# Patient Record
Sex: Female | Born: 1960 | Race: Black or African American | Hispanic: No | Marital: Married | Smoking: Never smoker
Health system: Southern US, Community
[De-identification: ages and names within clinical notes are randomized; demographics above are authoritative.]

## PROBLEM LIST (undated history)

## (undated) DIAGNOSIS — I10 Essential (primary) hypertension: Secondary | ICD-10-CM

## (undated) DIAGNOSIS — F32A Depression, unspecified: Secondary | ICD-10-CM

## (undated) DIAGNOSIS — Z8614 Personal history of Methicillin resistant Staphylococcus aureus infection: Secondary | ICD-10-CM

## (undated) DIAGNOSIS — F419 Anxiety disorder, unspecified: Secondary | ICD-10-CM

## (undated) DIAGNOSIS — L0292 Furuncle, unspecified: Secondary | ICD-10-CM

## (undated) HISTORY — DX: Depression, unspecified: F32.A

## (undated) HISTORY — PX: COLONOSCOPY: SHX174

## (undated) HISTORY — DX: Anxiety disorder, unspecified: F41.9

## (undated) HISTORY — DX: Furuncle, unspecified: L02.92

---

## 2010-04-18 ENCOUNTER — Emergency Department: Payer: Self-pay | Admitting: Emergency Medicine

## 2010-04-20 ENCOUNTER — Emergency Department: Payer: Self-pay | Admitting: Emergency Medicine

## 2010-08-17 HISTORY — PX: PARTIAL HYSTERECTOMY: SHX80

## 2011-08-23 ENCOUNTER — Emergency Department: Payer: Self-pay | Admitting: Emergency Medicine

## 2011-11-06 ENCOUNTER — Emergency Department: Payer: Self-pay | Admitting: Emergency Medicine

## 2014-05-06 ENCOUNTER — Emergency Department: Payer: Self-pay | Admitting: Emergency Medicine

## 2014-05-23 DIAGNOSIS — R030 Elevated blood-pressure reading, without diagnosis of hypertension: Secondary | ICD-10-CM | POA: Insufficient documentation

## 2014-06-26 ENCOUNTER — Ambulatory Visit: Payer: Self-pay | Admitting: Physician Assistant

## 2014-11-16 ENCOUNTER — Ambulatory Visit
Admit: 2014-11-16 | Disposition: A | Discharge status: Home / Self Care | Payer: Self-pay | Attending: Gastroenterology | Admitting: Gastroenterology

## 2014-12-10 LAB — SURGICAL PATHOLOGY

## 2014-12-14 DIAGNOSIS — I10 Essential (primary) hypertension: Secondary | ICD-10-CM | POA: Insufficient documentation

## 2015-09-12 ENCOUNTER — Other Ambulatory Visit: Payer: Self-pay | Admitting: Physician Assistant

## 2015-09-12 DIAGNOSIS — Z1231 Encounter for screening mammogram for malignant neoplasm of breast: Secondary | ICD-10-CM

## 2015-09-30 ENCOUNTER — Ambulatory Visit: Payer: Self-pay

## 2015-10-16 ENCOUNTER — Ambulatory Visit
Admission: RE | Admit: 2015-10-16 | Discharge: 2015-10-16 | Disposition: A | Discharge status: Home / Self Care | Payer: BLUE CROSS/BLUE SHIELD | Source: Ambulatory Visit | Attending: Physician Assistant | Admitting: Physician Assistant

## 2015-10-16 DIAGNOSIS — Z1231 Encounter for screening mammogram for malignant neoplasm of breast: Secondary | ICD-10-CM | POA: Diagnosis not present

## 2016-12-10 ENCOUNTER — Other Ambulatory Visit: Payer: Self-pay | Admitting: Physician Assistant

## 2016-12-10 DIAGNOSIS — E669 Obesity, unspecified: Secondary | ICD-10-CM | POA: Insufficient documentation

## 2016-12-10 DIAGNOSIS — Z1231 Encounter for screening mammogram for malignant neoplasm of breast: Secondary | ICD-10-CM

## 2017-01-07 ENCOUNTER — Ambulatory Visit
Admission: RE | Admit: 2017-01-07 | Discharge: 2017-01-07 | Disposition: A | Discharge status: Home / Self Care | Payer: BLUE CROSS/BLUE SHIELD | Source: Ambulatory Visit | Attending: Physician Assistant | Admitting: Physician Assistant

## 2017-01-07 DIAGNOSIS — Z1231 Encounter for screening mammogram for malignant neoplasm of breast: Secondary | ICD-10-CM | POA: Diagnosis not present

## 2018-01-05 ENCOUNTER — Other Ambulatory Visit: Payer: Self-pay | Admitting: Physician Assistant

## 2018-01-05 DIAGNOSIS — Z1231 Encounter for screening mammogram for malignant neoplasm of breast: Secondary | ICD-10-CM

## 2018-01-26 ENCOUNTER — Ambulatory Visit
Admission: RE | Admit: 2018-01-26 | Discharge: 2018-01-26 | Disposition: A | Discharge status: Home / Self Care | Payer: BLUE CROSS/BLUE SHIELD | Source: Ambulatory Visit | Attending: Physician Assistant | Admitting: Physician Assistant

## 2018-01-26 DIAGNOSIS — Z1231 Encounter for screening mammogram for malignant neoplasm of breast: Secondary | ICD-10-CM | POA: Insufficient documentation

## 2019-03-30 ENCOUNTER — Other Ambulatory Visit: Payer: Self-pay | Admitting: Physician Assistant

## 2019-03-30 DIAGNOSIS — Z1231 Encounter for screening mammogram for malignant neoplasm of breast: Secondary | ICD-10-CM

## 2020-04-26 ENCOUNTER — Ambulatory Visit
Admission: RE | Admit: 2020-04-26 | Discharge: 2020-04-26 | Disposition: A | Discharge status: Home / Self Care | Payer: 59 | Source: Ambulatory Visit | Attending: Physician Assistant | Admitting: Physician Assistant

## 2020-04-26 DIAGNOSIS — Z1231 Encounter for screening mammogram for malignant neoplasm of breast: Secondary | ICD-10-CM | POA: Insufficient documentation

## 2021-04-08 ENCOUNTER — Other Ambulatory Visit: Payer: Self-pay | Admitting: Physician Assistant

## 2021-04-08 DIAGNOSIS — Z1231 Encounter for screening mammogram for malignant neoplasm of breast: Secondary | ICD-10-CM

## 2021-04-23 ENCOUNTER — Encounter (INDEPENDENT_AMBULATORY_CARE_PROVIDER_SITE_OTHER): Payer: Self-pay

## 2021-04-23 ENCOUNTER — Encounter: Payer: Self-pay | Admitting: Oncology

## 2021-04-23 ENCOUNTER — Inpatient Hospital Stay: Payer: 59 | Attending: Oncology | Admitting: Oncology

## 2021-04-23 ENCOUNTER — Inpatient Hospital Stay: Payer: 59

## 2021-04-23 ENCOUNTER — Other Ambulatory Visit: Payer: Self-pay

## 2021-04-23 VITALS — BP 148/76 | HR 57 | Temp 97.1°F | Resp 17 | Wt 270.0 lb

## 2021-04-23 DIAGNOSIS — D72829 Elevated white blood cell count, unspecified: Secondary | ICD-10-CM

## 2021-04-23 DIAGNOSIS — Z808 Family history of malignant neoplasm of other organs or systems: Secondary | ICD-10-CM | POA: Diagnosis not present

## 2021-04-23 DIAGNOSIS — A4902 Methicillin resistant Staphylococcus aureus infection, unspecified site: Secondary | ICD-10-CM | POA: Insufficient documentation

## 2021-04-23 DIAGNOSIS — F419 Anxiety disorder, unspecified: Secondary | ICD-10-CM | POA: Insufficient documentation

## 2021-04-23 LAB — CBC WITH DIFFERENTIAL/PLATELET
Abs Immature Granulocytes: 0.07 10*3/uL (ref 0.00–0.07)
Basophils Absolute: 0.1 10*3/uL (ref 0.0–0.1)
Basophils Relative: 1 %
Eosinophils Absolute: 0.3 10*3/uL (ref 0.0–0.5)
Eosinophils Relative: 2 %
HCT: 41.2 % (ref 36.0–46.0)
Hemoglobin: 13.6 g/dL (ref 12.0–15.0)
Immature Granulocytes: 1 %
Lymphocytes Relative: 26 %
Lymphs Abs: 2.9 10*3/uL (ref 0.7–4.0)
MCH: 29.4 pg (ref 26.0–34.0)
MCHC: 33 g/dL (ref 30.0–36.0)
MCV: 89 fL (ref 80.0–100.0)
Monocytes Absolute: 0.8 10*3/uL (ref 0.1–1.0)
Monocytes Relative: 7 %
Neutro Abs: 7.3 10*3/uL (ref 1.7–7.7)
Neutrophils Relative %: 63 %
Platelets: 236 10*3/uL (ref 150–400)
RBC: 4.63 MIL/uL (ref 3.87–5.11)
RDW: 14.4 % (ref 11.5–15.5)
WBC: 11.3 10*3/uL — ABNORMAL HIGH (ref 4.0–10.5)
nRBC: 0 % (ref 0.0–0.2)

## 2021-04-23 LAB — TECHNOLOGIST SMEAR REVIEW: Plt Morphology: NORMAL

## 2021-04-23 LAB — TSH: TSH: 3.28 u[IU]/mL (ref 0.350–4.500)

## 2021-04-23 LAB — LACTATE DEHYDROGENASE: LDH: 155 U/L (ref 98–192)

## 2021-04-23 NOTE — Progress Notes (Signed)
Hematology/Oncology Consult note [facility] Telephone:([phone] Fax:([phone]) [phone]   Patient Care Team: Patrice Paradise, MD as PCP - General (Physician Assistant)  REFERRING PROVIDER: Patrice Paradise, MD  CHIEF COMPLAINTS/REASON FOR VISIT:  Evaluation of leukocytosis  HISTORY OF PRESENTING ILLNESS:  Laura Shaw is a  60 y.o.  female with PMH listed below who was referred to me for evaluation of leukocytosis Reviewed patient' recent labs obtained by PCP.   04/08/2021 CBC showed elevated white count of 13.6  Previous lab records reviewed. Leukocytosis onset of chronic, duration is since 2017  No aggravating or elevated factors. Associated symptoms or signs:  Denies weight loss, fever, chills,night sweats.  She does not smoke. Denies recent oral steroid use or steroid injection:  Denies any joint pain or stiffness.    Review of Systems  Constitutional:  Negative for appetite change, chills, fatigue and fever.  HENT:   Negative for hearing loss and voice change.   Eyes:  Negative for eye problems.  Respiratory:  Negative for chest tightness and cough.   Cardiovascular:  Negative for chest pain.  Gastrointestinal:  Negative for abdominal distention, abdominal pain and blood in stool.  Endocrine: Negative for hot flashes.  Genitourinary:  Negative for difficulty urinating and frequency.   Musculoskeletal:  Negative for arthralgias.  Skin:  Negative for itching and rash.  Neurological:  Negative for extremity weakness.  Hematological:  Negative for adenopathy.  Psychiatric/Behavioral:  Negative for confusion.     MEDICAL HISTORY:  Past Medical History:  Diagnosis Date   Anxiety    Boils of multiple sites    Depression     SURGICAL HISTORY: Past Surgical History:  Procedure Laterality Date   PARTIAL HYSTERECTOMY  2012    SOCIAL HISTORY: Social History   Socioeconomic History   Marital status: Single    Spouse name: Not on  file   Number of children: Not on file   Years of education: Not on file   Highest education level: Not on file  Occupational History   Not on file  Tobacco Use   Smoking status: Never   Smokeless tobacco: Never  Substance and Sexual Activity   Alcohol use: Not Currently   Drug use: Not Currently   Sexual activity: Not on file  Other Topics Concern   Not on file  Social History Narrative   Not on file   Social Determinants of Health   Financial Resource Strain: Not on file  Food Insecurity: Not on file  Transportation Needs: Not on file  Physical Activity: Not on file  Stress: Not on file  Social Connections: Not on file  Intimate Partner Violence: Not on file    FAMILY HISTORY: Family History  Problem Relation Age of Onset   Liver cancer Mother    Diabetes Father    Breast cancer Neg Hx     ALLERGIES:  has no allergies on file.  MEDICATIONS:  Current Outpatient Medications  Medication Sig Dispense Refill   Cholecalciferol 50 MCG (2000 UT) CAPS Take by mouth.     cyanocobalamin 1000 MCG tablet Take by mouth.     escitalopram (LEXAPRO) 10 MG tablet Take 10 mg by mouth daily.     hydrochlorothiazide (HYDRODIURIL) 25 MG tablet Take 25 mg by mouth daily.     propranolol (INDERAL) 10 MG tablet Take 10 mg by mouth 2 (two) times daily.     No current facility-administered medications for this visit.     PHYSICAL EXAMINATION: ECOG  PERFORMANCE STATUS: 0 - Asymptomatic Vitals:   04/23/21 1107 04/23/21 1108  BP:  (!) 148/76  Pulse: (!) 57   Resp: 17   Temp: (!) 97.1 F (36.2 C)   SpO2: 98%    Filed Weights   04/23/21 1107  Weight: 270 lb (122.5 kg)    Physical Exam Constitutional:      General: She is not in acute distress.    Appearance: She is obese.  HENT:     Head: Normocephalic and atraumatic.  Eyes:     General: No scleral icterus. Cardiovascular:     Rate and Rhythm: Normal rate and regular rhythm.     Heart sounds: Normal heart sounds.   Pulmonary:     Effort: Pulmonary effort is normal. No respiratory distress.     Breath sounds: No wheezing.  Abdominal:     General: Bowel sounds are normal. There is no distension.     Palpations: Abdomen is soft.  Musculoskeletal:        General: No deformity. Normal range of motion.     Cervical back: Normal range of motion and neck supple.  Skin:    General: Skin is warm and dry.     Findings: No erythema or rash.  Neurological:     Mental Status: She is alert and oriented to person, place, and time. Mental status is at baseline.     Cranial Nerves: No cranial nerve deficit.     Coordination: Coordination normal.  Psychiatric:        Mood and Affect: Mood normal.    No flowsheet data found. CBC Latest Ref Rng & Units 04/23/2021  WBC 4.0 - 10.5 K/uL 11.3(H)  Hemoglobin 12.0 - 15.0 g/dL 01.7  Hematocrit 51.0 - 46.0 % 41.2  Platelets 150 - 400 K/uL 236     RADIOGRAPHIC STUDIES: I have personally reviewed the radiological images as listed and agreed with the findings in the report. No results found.  LABORATORY DATA:  I have reviewed the data as listed Lab Results  Component Value Date   WBC 11.3 (H) 04/23/2021   HGB 13.6 04/23/2021   HCT 41.2 04/23/2021   MCV 89.0 04/23/2021   PLT 236 04/23/2021   No results for input(s): NA, K, CL, CO2, GLUCOSE, BUN, CREATININE, CALCIUM, GFRNONAA, GFRAA, PROT, ALBUMIN, AST, ALT, ALKPHOS, BILITOT, BILIDIR, IBILI in the last 8760 hours. Iron/TIBC/Ferritin/ %Sat No results found for: IRON, TIBC, FERRITIN, IRONPCTSAT      ASSESSMENT & PLAN:  1. Leukocytosis, unspecified type    Labs reviewed and discussed with patient that Leukocytosis, predominantly neutrophilia, can be secondary to infection, chronic inflammation, smoking, autoimmune disease, or underlying bone marrow disorders.  Obesity may contribute to leukocytosis.  For the work up of patient's leukocytosis, I recommend checking CBC;CMP, LDH,  SPEP,smear review,  flowcytometry, ANA etc.    Orders Placed This Encounter  Procedures   CBC with Differential/Platelet    Standing Status:   Future    Number of Occurrences:   1    Standing Expiration Date:   04/23/2022   Technologist smear review    Standing Status:   Future    Number of Occurrences:   1    Standing Expiration Date:   04/23/2022   Lactate dehydrogenase    Standing Status:   Future    Number of Occurrences:   1    Standing Expiration Date:   04/23/2022   Flow cytometry panel-leukemia/lymphoma work-up    Standing Status:   Future  Number of Occurrences:   1    Standing Expiration Date:   04/23/2022   ANA, IFA (with reflex)    Standing Status:   Future    Number of Occurrences:   1    Standing Expiration Date:   04/23/2022   Protein electrophoresis, serum    Standing Status:   Future    Number of Occurrences:   1    Standing Expiration Date:   04/23/2022   TSH    Standing Status:   Future    Number of Occurrences:   1    Standing Expiration Date:   04/23/2022    All questions were answered. The patient knows to call the clinic with any problems questions or concerns.  Return of visit: 2 weeks to discuss labs. Thank you for this kind referral and the opportunity to participate in the care of this patient. A copy of today's note is routed to referring provider   Rickard Patience, MD, PhD Hematology Oncology [facility]  04/23/2021

## 2021-04-25 LAB — COMP PANEL: LEUKEMIA/LYMPHOMA

## 2021-04-28 LAB — PROTEIN ELECTROPHORESIS, SERUM
A/G Ratio: 0.9 (ref 0.7–1.7)
Albumin ELP: 3.3 g/dL (ref 2.9–4.4)
Alpha-1-Globulin: 0.2 g/dL (ref 0.0–0.4)
Alpha-2-Globulin: 0.8 g/dL (ref 0.4–1.0)
Beta Globulin: 1.1 g/dL (ref 0.7–1.3)
Gamma Globulin: 1.6 g/dL (ref 0.4–1.8)
Globulin, Total: 3.8 g/dL (ref 2.2–3.9)
Total Protein ELP: 7.1 g/dL (ref 6.0–8.5)

## 2021-04-29 ENCOUNTER — Other Ambulatory Visit: Payer: Self-pay

## 2021-04-29 ENCOUNTER — Ambulatory Visit
Admission: RE | Admit: 2021-04-29 | Discharge: 2021-04-29 | Disposition: A | Discharge status: Home / Self Care | Payer: 59 | Source: Ambulatory Visit | Attending: Physician Assistant | Admitting: Physician Assistant

## 2021-04-29 DIAGNOSIS — Z1231 Encounter for screening mammogram for malignant neoplasm of breast: Secondary | ICD-10-CM | POA: Diagnosis present

## 2021-04-30 LAB — ANTINUCLEAR ANTIBODIES, IFA: ANA Ab, IFA: NEGATIVE

## 2021-05-08 ENCOUNTER — Other Ambulatory Visit: Payer: Self-pay

## 2021-05-08 ENCOUNTER — Encounter: Payer: Self-pay | Admitting: Oncology

## 2021-05-08 ENCOUNTER — Inpatient Hospital Stay (HOSPITAL_BASED_OUTPATIENT_CLINIC_OR_DEPARTMENT_OTHER): Payer: 59 | Admitting: Oncology

## 2021-05-08 DIAGNOSIS — D72829 Elevated white blood cell count, unspecified: Secondary | ICD-10-CM

## 2021-05-08 NOTE — Progress Notes (Signed)
HEMATOLOGY-ONCOLOGY TeleHEALTH VISIT PROGRESS NOTE  I connected with Laura Shaw on 05/08/21  at  2:15 PM EDT by video enabled telemedicine visit and verified that I am speaking with the correct person using two identifiers. I discussed the limitations, risks, security and privacy concerns of performing an evaluation and management service by telemedicine and the availability of in-person appointments. The patient expressed understanding and agreed to proceed.   Other persons participating in the visit and their role in the encounter:  None  Patient's location: Home  Provider's location: office Chief Complaint: Follow-up for leukocytosis   INTERVAL HISTORY Laura Shaw is a 60 y.o. female who has above history reviewed by me today presents for follow up visit for leukocytosis  Patient has had a blood work done 2 weeks ago and present to discuss results.   Review of Systems  Constitutional:  Negative for appetite change, chills, fatigue and fever.  HENT:   Negative for hearing loss and voice change.   Eyes:  Negative for eye problems.  Respiratory:  Negative for chest tightness and cough.   Cardiovascular:  Negative for chest pain.  Gastrointestinal:  Negative for abdominal distention, abdominal pain and blood in stool.  Endocrine: Negative for hot flashes.  Genitourinary:  Negative for difficulty urinating and frequency.   Musculoskeletal:  Negative for arthralgias.  Skin:  Negative for itching and rash.  Neurological:  Negative for extremity weakness.  Hematological:  Negative for adenopathy.  Psychiatric/Behavioral:  Negative for confusion.    Past Medical History:  Diagnosis Date   Anxiety    Boils of multiple sites    Depression    Past Surgical History:  Procedure Laterality Date   PARTIAL HYSTERECTOMY  2012    Family History  Problem Relation Age of Onset   Liver cancer Mother    Diabetes Father    Breast cancer Neg Hx     Social History   Socioeconomic  History   Marital status: Single    Spouse name: Not on file   Number of children: Not on file   Years of education: Not on file   Highest education level: Not on file  Occupational History   Not on file  Tobacco Use   Smoking status: Never   Smokeless tobacco: Never  Substance and Sexual Activity   Alcohol use: Not Currently   Drug use: Not Currently   Sexual activity: Not on file  Other Topics Concern   Not on file  Social History Narrative   Not on file   Social Determinants of Health   Financial Resource Strain: Not on file  Food Insecurity: Not on file  Transportation Needs: Not on file  Physical Activity: Not on file  Stress: Not on file  Social Connections: Not on file  Intimate Partner Violence: Not on file    Current Outpatient Medications on File Prior to Visit  Medication Sig Dispense Refill   Cholecalciferol 50 MCG (2000 UT) CAPS Take by mouth.     cyanocobalamin 1000 MCG tablet Take by mouth.     escitalopram (LEXAPRO) 10 MG tablet Take 10 mg by mouth daily.     hydrochlorothiazide (HYDRODIURIL) 25 MG tablet Take 25 mg by mouth daily.     propranolol (INDERAL) 10 MG tablet Take 10 mg by mouth 2 (two) times daily.     No current facility-administered medications on file prior to visit.    Not on File     Observations/Objective: Today's Vitals   05/08/21 1356  PainSc:  0-No pain   There is no height or weight on file to calculate BMI.  Physical Exam  CBC    Component Value Date/Time   WBC 11.3 (H) 04/23/2021 1134   RBC 4.63 04/23/2021 1134   HGB 13.6 04/23/2021 1134   HCT 41.2 04/23/2021 1134   PLT 236 04/23/2021 1134   MCV 89.0 04/23/2021 1134   MCH 29.4 04/23/2021 1134   MCHC 33.0 04/23/2021 1134   RDW 14.4 04/23/2021 1134   LYMPHSABS 2.9 04/23/2021 1134   MONOABS 0.8 04/23/2021 1134   EOSABS 0.3 04/23/2021 1134   BASOSABS 0.1 04/23/2021 1134    CMP  No results found for: NA, K, CL, CO2, GLUCOSE, BUN, CREATININE, CALCIUM, PROT, ALBUMIN,  AST, ALT, ALKPHOS, BILITOT, GFRNONAA, GFRAA   Assessment and Plan: 1. Leukocytosis, unspecified type   Labs reviewed and discussed with patient.  Patient has slightly elevated total WBC with complete normal differential. Negative protein electrophoresis, negative peripheral blood flow cytometry, negative ANA, negative LDH.  Normal TSH. Discussed with the patient that the leukocytosis most likely reactive.  Patient has no constitutional symptoms. Recommend observation.  Follow Up Instructions: Patient will be discharged to follow-up with primary care provider.  She agrees with the plan.   I discussed the assessment and treatment plan with the patient. The patient was provided an opportunity to ask questions and all were answered. The patient agreed with the plan and demonstrated an understanding of the instructions.  The patient was advised to call back or seek an in-person evaluation if the symptoms worsen or if the condition fails to improve as anticipated.  Rickard Patience, MD 05/08/2021 7:53 PM

## 2021-11-12 IMAGING — MG MM DIGITAL SCREENING BILAT W/ TOMO AND CAD
8 of 15 series · 8 of 40 positions shown · non-contrast
Comparison: Previous exam(s).

CLINICAL DATA: Screening.

EXAM:
DIGITAL SCREENING BILATERAL MAMMOGRAM WITH TOMOSYNTHESIS AND CAD
TECHNIQUE: Bilateral screening digital craniocaudal and mediolateral oblique
mammograms were obtained. Bilateral screening digital breast
tomosynthesis was performed. The images were evaluated with
computer-aided detection.

[R CC synth-2D (1 of 2)]
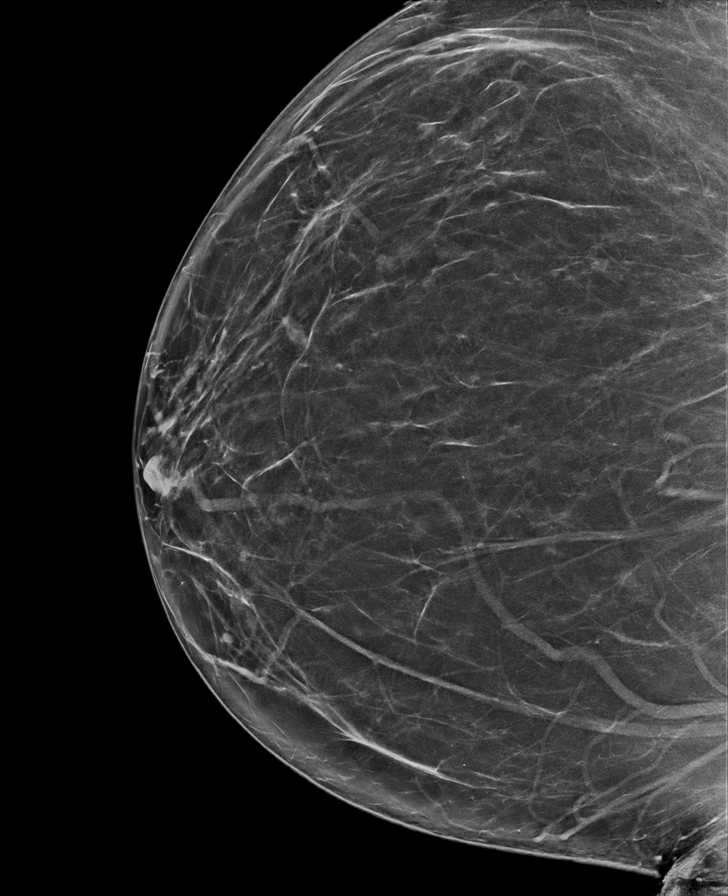

[R CC synth-2D (2 of 2)]
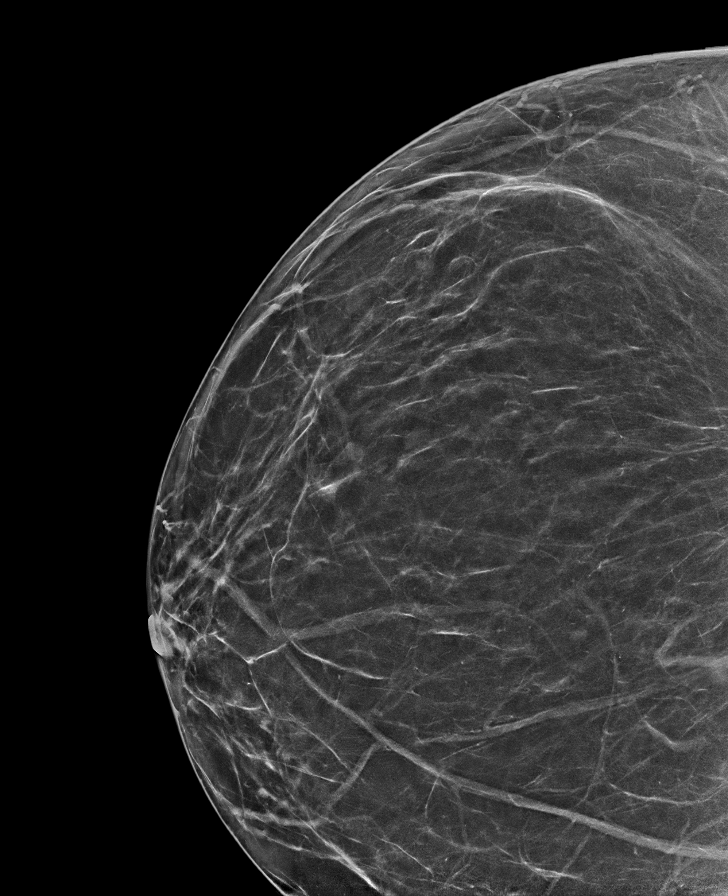

[R MLO synth-2D (1 of 2)]
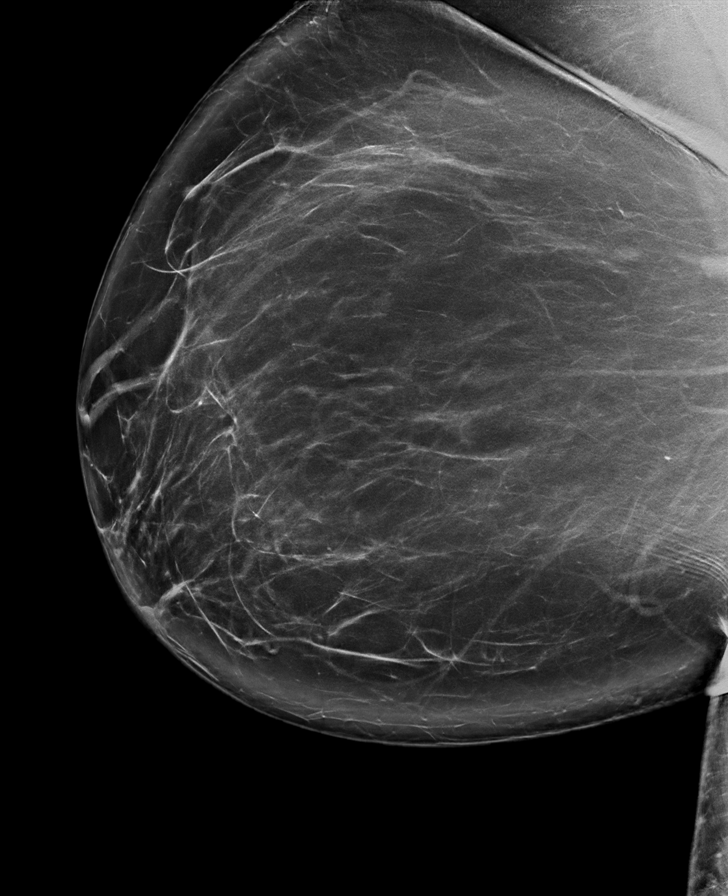

[L MLO synth-2D (1 of 2)]
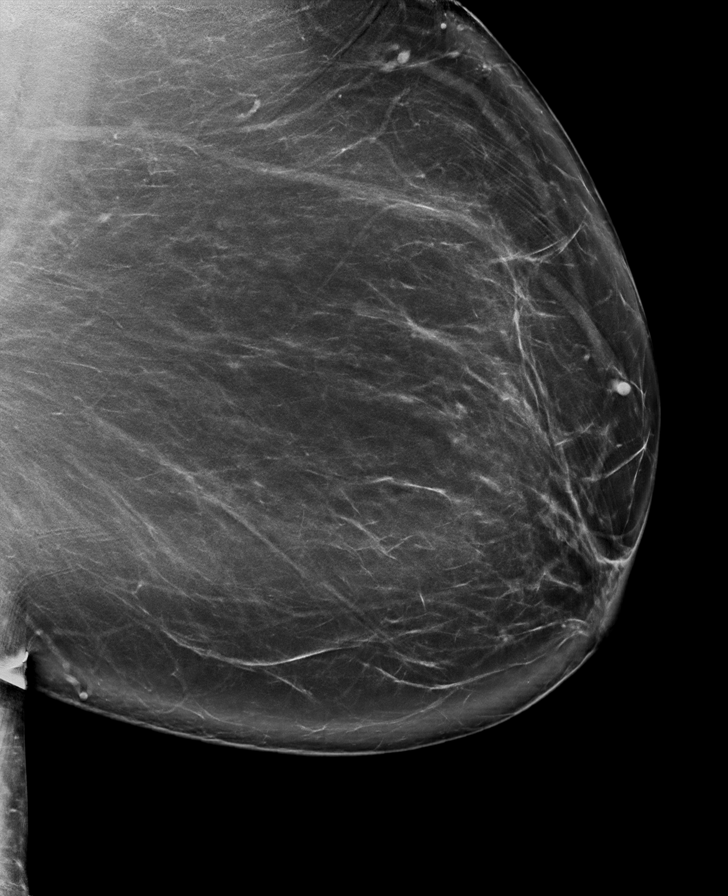

[L CC synth-2D]
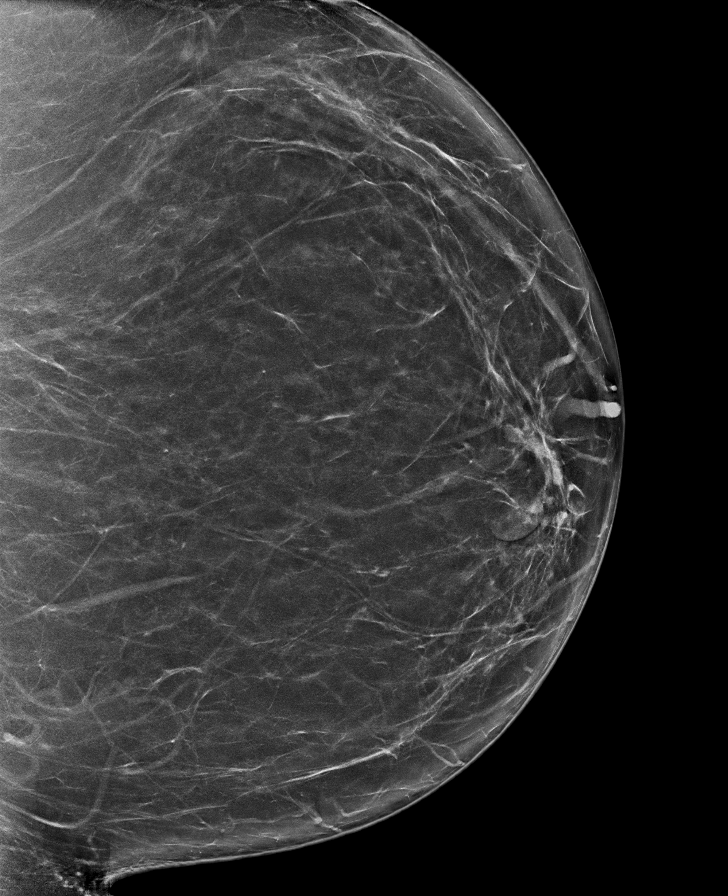

[R MLO synth-2D (2 of 2)]
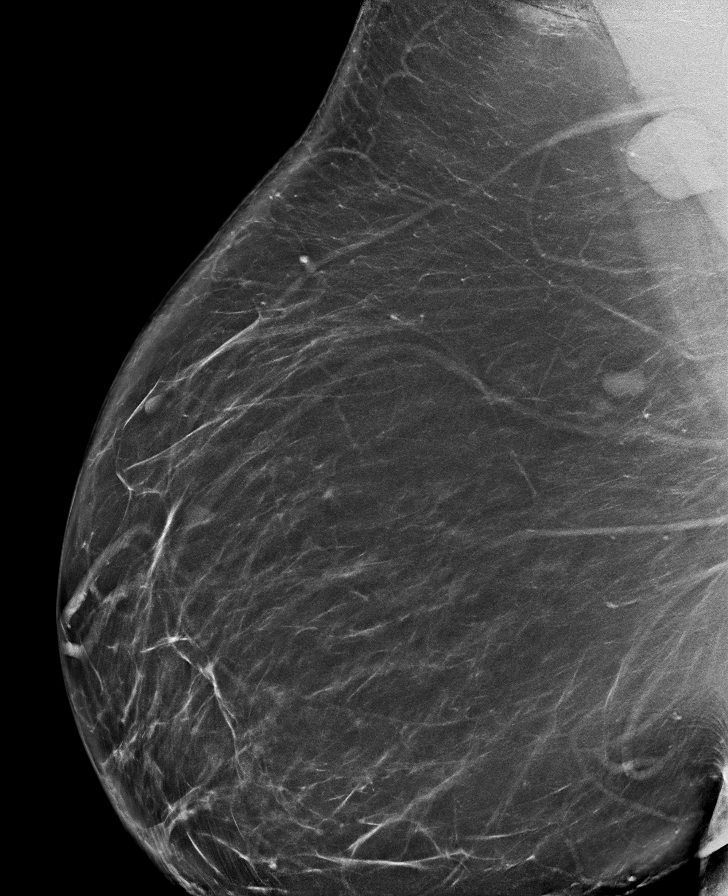

[L MLO synth-2D (2 of 2)]
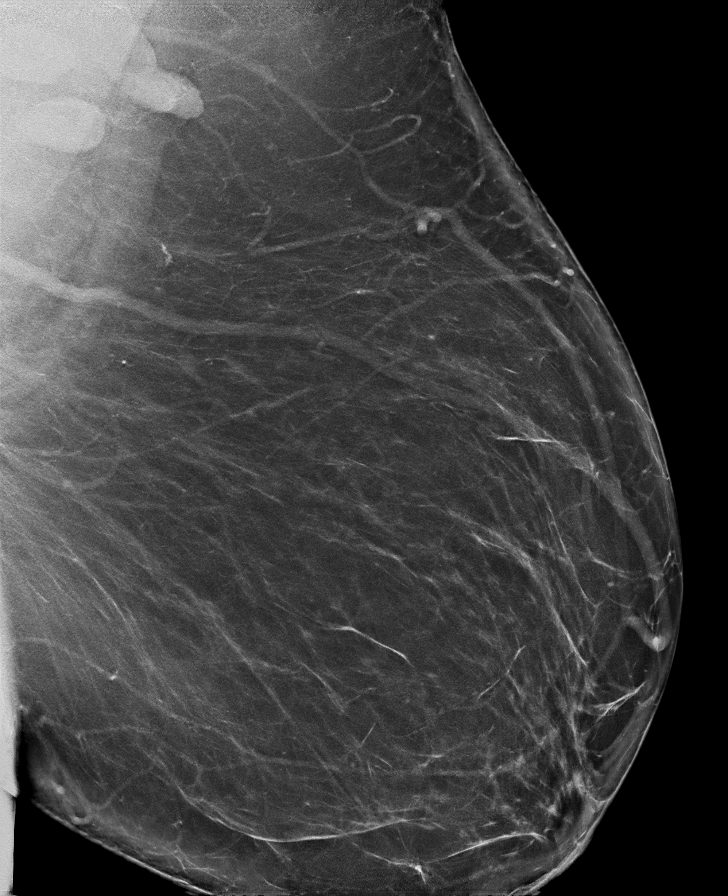

[L CC tomo · tomo slice 54/79.0]
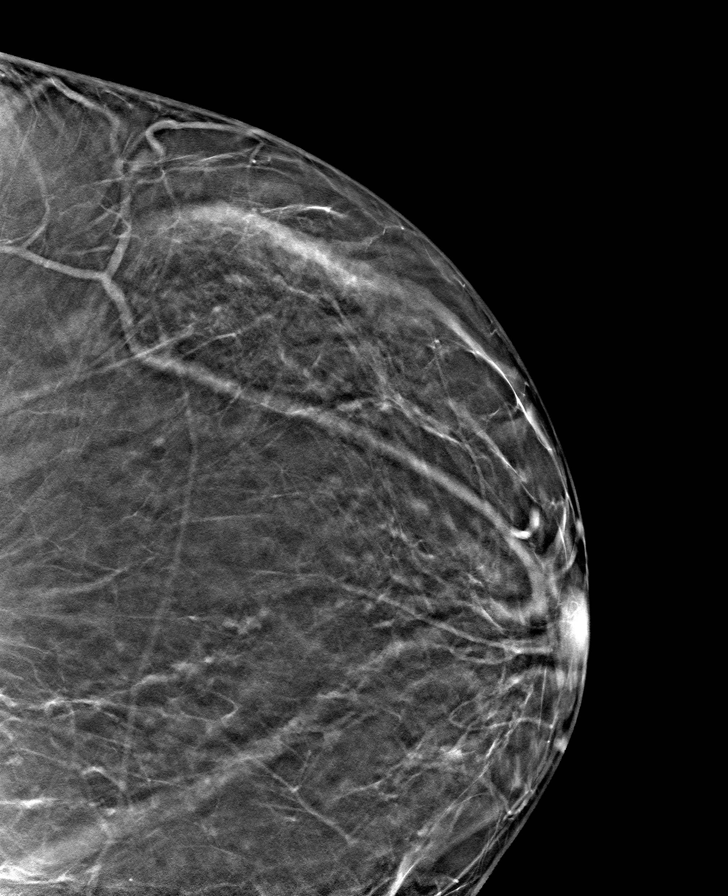

[8 of 40 positions shown; findings below may reference images not displayed]

ACR Breast Density Category b: There are scattered areas of
fibroglandular density.
FINDINGS: There are no findings suspicious for malignancy.
IMPRESSION: No mammographic evidence of malignancy. A result letter of this
screening mammogram will be mailed directly to the patient.

RECOMMENDATION:
Screening mammogram in one year. (Code:51-O-LD2)

BI-RADS CATEGORY  1: Negative.

## 2021-12-17 ENCOUNTER — Other Ambulatory Visit: Payer: Self-pay | Admitting: Physician Assistant

## 2021-12-17 DIAGNOSIS — Z1231 Encounter for screening mammogram for malignant neoplasm of breast: Secondary | ICD-10-CM

## 2021-12-19 ENCOUNTER — Encounter: Payer: Self-pay | Admitting: *Deleted

## 2021-12-19 ENCOUNTER — Ambulatory Visit: Payer: No Typology Code available for payment source | Admitting: Anesthesiology

## 2021-12-19 ENCOUNTER — Encounter
Admission: RE | Disposition: A | Discharge status: Home / Self Care | Payer: Self-pay | Source: Home / Self Care | Attending: Gastroenterology

## 2021-12-19 ENCOUNTER — Ambulatory Visit
Admission: RE | Admit: 2021-12-19 | Discharge: 2021-12-19 | Disposition: A | Discharge status: Home / Self Care | Payer: No Typology Code available for payment source | Attending: Gastroenterology | Admitting: Gastroenterology

## 2021-12-19 DIAGNOSIS — Z1211 Encounter for screening for malignant neoplasm of colon: Secondary | ICD-10-CM | POA: Insufficient documentation

## 2021-12-19 DIAGNOSIS — G4730 Sleep apnea, unspecified: Secondary | ICD-10-CM | POA: Diagnosis not present

## 2021-12-19 DIAGNOSIS — F419 Anxiety disorder, unspecified: Secondary | ICD-10-CM | POA: Diagnosis not present

## 2021-12-19 DIAGNOSIS — K5730 Diverticulosis of large intestine without perforation or abscess without bleeding: Secondary | ICD-10-CM | POA: Insufficient documentation

## 2021-12-19 DIAGNOSIS — Z86010 Personal history of colonic polyps: Secondary | ICD-10-CM | POA: Insufficient documentation

## 2021-12-19 DIAGNOSIS — F32A Depression, unspecified: Secondary | ICD-10-CM | POA: Insufficient documentation

## 2021-12-19 DIAGNOSIS — Z8614 Personal history of Methicillin resistant Staphylococcus aureus infection: Secondary | ICD-10-CM | POA: Diagnosis not present

## 2021-12-19 DIAGNOSIS — Z6835 Body mass index (BMI) 35.0-35.9, adult: Secondary | ICD-10-CM | POA: Insufficient documentation

## 2021-12-19 DIAGNOSIS — I10 Essential (primary) hypertension: Secondary | ICD-10-CM | POA: Insufficient documentation

## 2021-12-19 HISTORY — DX: Personal history of Methicillin resistant Staphylococcus aureus infection: Z86.14

## 2021-12-19 HISTORY — DX: Essential (primary) hypertension: I10

## 2021-12-19 HISTORY — DX: Morbid (severe) obesity due to excess calories: E66.01

## 2021-12-19 HISTORY — PX: COLONOSCOPY WITH PROPOFOL: SHX5780

## 2021-12-19 SURGERY — COLONOSCOPY WITH PROPOFOL
Anesthesia: General

## 2021-12-19 MED ORDER — PROPOFOL 500 MG/50ML IV EMUL
INTRAVENOUS | Status: DC | PRN
  Administered 2021-12-19: 200 ug/kg/min via INTRAVENOUS

## 2021-12-19 MED ORDER — PHENYLEPHRINE HCL (PRESSORS) 10 MG/ML IV SOLN
INTRAVENOUS | Status: AC
  Filled 2021-12-19: qty 1

## 2021-12-19 MED ORDER — PROPOFOL 10 MG/ML IV BOLUS: INTRAVENOUS | Status: DC | PRN

## 2021-12-19 MED ORDER — PROPOFOL 500 MG/50ML IV EMUL
INTRAVENOUS | Status: AC
  Filled 2021-12-19: qty 50

## 2021-12-19 MED ORDER — SODIUM CHLORIDE 0.9 % IV SOLN: INTRAVENOUS | Status: DC

## 2021-12-19 MED ORDER — PROPOFOL 10 MG/ML IV BOLUS
INTRAVENOUS | Status: DC | PRN
  Administered 2021-12-19: 50 mg via INTRAVENOUS
  Administered 2021-12-19: 20 mg via INTRAVENOUS

## 2021-12-19 MED ORDER — SODIUM CHLORIDE 0.9 % IV SOLN: INTRAVENOUS | Status: DC | PRN

## 2021-12-19 NOTE — Transfer of Care (Signed)
Immediate Anesthesia Transfer of Care Note ? ?Patient: Laura Shaw ? ?Procedure(s) Performed: COLONOSCOPY WITH PROPOFOL ? ?Patient Location: PACU ? ?Anesthesia Type:General ? ?Level of Consciousness: awake ? ?Airway & Oxygen Therapy: Patient Spontanous Breathing and Patient connected to face mask oxygen ? ?Post-op Assessment: Report given to RN and Post -op Vital signs reviewed and stable ? ?Post vital signs: Reviewed and stable ? ?Last Vitals:  ?Vitals Value Taken Time  ?BP 119/64 12/19/21 0813  ?Temp    ?Pulse 95 12/19/21 0814  ?Resp 15 12/19/21 0814  ?SpO2 100 % 12/19/21 0814  ?Vitals shown include unvalidated device data. ? ?Last Pain:  ?Vitals:  ? 12/19/21 0705  ?TempSrc: Temporal  ?PainSc: 0-No pain  ?   ? ?  ? ?Complications: No notable events documented. ?

## 2021-12-19 NOTE — Anesthesia Preprocedure Evaluation (Signed)
Anesthesia Evaluation  ?Patient identified by MRN, date of birth, ID band ?Patient awake ? ? ? ?Reviewed: ?Allergy & Precautions, NPO status , Patient's Chart, lab work & pertinent test results ? ?History of Anesthesia Complications ?Negative for: history of anesthetic complications ? ?Airway ?Mallampati: III ? ?TM Distance: <3 FB ?Neck ROM: full ? ? ? Dental ? ?(+) Chipped, Poor Dentition, Missing ?  ?Pulmonary ?sleep apnea ,  ?  ?Pulmonary exam normal ? ? ? ? ? ? ? Cardiovascular ?Exercise Tolerance: Good ?hypertension, (-) anginaNormal cardiovascular exam ? ? ?  ?Neuro/Psych ?PSYCHIATRIC DISORDERS negative neurological ROS ?   ? GI/Hepatic ?negative GI ROS, Neg liver ROS, neg GERD  ,  ?Endo/Other  ?negative endocrine ROS ? Renal/GU ?negative Renal ROS  ?negative genitourinary ?  ?Musculoskeletal ? ? Abdominal ?  ?Peds ? Hematology ?negative hematology ROS ?(+)   ?Anesthesia Other Findings ?Past Medical History: ?No date: Anxiety ?No date: Boils of multiple sites ?No date: Depression ?No date: History of methicillin resistant staphylococcus aureus (MRSA) ?No date: Hypertension ?No date: Morbid obesity with BMI of 45.0-49.9, adult (HCC) ? ?Past Surgical History: ?No date: COLONOSCOPY ?2012: PARTIAL HYSTERECTOMY ? ?BMI   ? Body Mass Index: 35.53 kg/m?  ?  ? ? Reproductive/Obstetrics ?negative OB ROS ? ?  ? ? ? ? ? ? ? ? ? ? ? ? ? ?  ?  ? ? ? ? ? ? ? ? ?Anesthesia Physical ?Anesthesia Plan ? ?ASA: 3 ? ?Anesthesia Plan: General  ? ?Post-op Pain Management:   ? ?Induction: Intravenous ? ?PONV Risk Score and Plan: Propofol infusion and TIVA ? ?Airway Management Planned: Natural Airway and Nasal Cannula ? ?Additional Equipment:  ? ?Intra-op Plan:  ? ?Post-operative Plan:  ? ?Informed Consent: I have reviewed the patients History and Physical, chart, labs and discussed the procedure including the risks, benefits and alternatives for the proposed anesthesia with the patient or authorized  representative who has indicated his/her understanding and acceptance.  ? ? ? ?Dental Advisory Given ? ?Plan Discussed with: Anesthesiologist, CRNA and Surgeon ? ?Anesthesia Plan Comments: (Patient consented for risks of anesthesia including but not limited to:  ?- adverse reactions to medications ?- risk of airway placement if required ?- damage to eyes, teeth, lips or other oral mucosa ?- nerve damage due to positioning  ?- sore throat or hoarseness ?- Damage to heart, brain, nerves, lungs, other parts of body or loss of life ? ?Patient voiced understanding.)  ? ? ? ? ? ? ?Anesthesia Quick Evaluation ? ?

## 2021-12-19 NOTE — Anesthesia Postprocedure Evaluation (Signed)
Anesthesia Post Note ? ?Patient: Laura Shaw ? ?Procedure(s) Performed: COLONOSCOPY WITH PROPOFOL ? ?Patient location during evaluation: Endoscopy ?Anesthesia Type: General ?Level of consciousness: awake and alert ?Pain management: pain level controlled ?Vital Signs Assessment: post-procedure vital signs reviewed and stable ?Respiratory status: spontaneous breathing, nonlabored ventilation, respiratory function stable and patient connected to nasal cannula oxygen ?Cardiovascular status: blood pressure returned to baseline and stable ?Postop Assessment: no apparent nausea or vomiting ?Anesthetic complications: no ? ? ?No notable events documented. ? ? ?Last Vitals:  ?Vitals:  ? 12/19/21 0830 12/19/21 0840  ?BP: 110/69 112/75  ?Pulse: 86 80  ?Resp: 19 15  ?Temp:    ?SpO2: 100% 100%  ?  ?Last Pain:  ?Vitals:  ? 12/19/21 0810  ?TempSrc: Temporal  ?PainSc:   ? ? ?  ?  ?  ?  ?  ?  ? ?[name] ? ? ? ? ?

## 2021-12-19 NOTE — Anesthesia Procedure Notes (Signed)
Date/Time: 12/19/2021 7:42 AM ?Performed by: Junious Silk, CRNA ? ? ? ? ?

## 2021-12-19 NOTE — H&P (Signed)
Outpatient short stay form Pre-procedure ?12/19/2021  ?Regis Bill, MD ? ?Primary Physician: Patrice Paradise, MD ? ?Reason for visit:  Surveillance ? ?History of present illness:   ? ?61 y/o lady with history of obesity and hypertension here for surveillance colonoscopy. Last colonoscopy was in 2016 with small TA. No blood thinners. No family history of GI malignancies. History of partial hysterectomy. ? ? ? ?Current Facility-Administered Medications:  ?  0.9 %  sodium chloride infusion, , Intravenous, Continuous, Zahraa Bhargava, Rossie Muskrat, MD, Last Rate: 20 mL/hr at 12/19/21 0718, New Bag at 12/19/21 0718 ? ?Medications Prior to Admission  ?Medication Sig Dispense Refill Last Dose  ? Cholecalciferol 50 MCG (2000 UT) CAPS Take by mouth.   12/18/2021  ? cyanocobalamin 1000 MCG tablet Take by mouth.   12/18/2021  ? escitalopram (LEXAPRO) 10 MG tablet Take 10 mg by mouth daily.   12/18/2021  ? hydrochlorothiazide (HYDRODIURIL) 25 MG tablet Take 25 mg by mouth daily.   12/18/2021  ? propranolol (INDERAL) 10 MG tablet Take 10 mg by mouth 2 (two) times daily.   12/18/2021  ? ? ? ?No Known Allergies ? ? ?Past Medical History:  ?Diagnosis Date  ? Anxiety   ? Boils of multiple sites   ? Depression   ? History of methicillin resistant staphylococcus aureus (MRSA)   ? Hypertension   ? Morbid obesity with BMI of 45.0-49.9, adult (HCC)   ? ? ?Review of systems:  Otherwise negative.  ? ? ?Physical Exam ? ?Gen: Alert, oriented. Appears stated age.  ?HEENT: PERRLA. ?Lungs: No respiratory distress ?CV: RRR ?Abd: soft, benign, no masses ?Ext: No edema ? ? ? ?Planned procedures: Proceed with colonoscopy. The patient understands the nature of the planned procedure, indications, risks, alternatives and potential complications including but not limited to bleeding, infection, perforation, damage to internal organs and possible oversedation/side effects from anesthesia. The patient agrees and gives consent to proceed.  ?Please refer to  procedure notes for findings, recommendations and patient disposition/instructions.  ? ? ? ?Regis Bill, MD ?[facility] ? ? ? ?  ? ?

## 2021-12-19 NOTE — Op Note (Signed)
[facility] ?Gastroenterology ?Patient Name: Hadessah Grennan ?Procedure Date: 12/19/2021 6:56 AM ?MRN: [id number] ?Account #: [id number] ?Date of Birth: [birth year] ?Admit Type: Outpatient ?Age: 61 ?Room: [facility] ENDO ROOM 3 ?Gender: Female ?Note Status: Finalized ?Instrument Name: Colonoscope [id number] ?Procedure:             Colonoscopy ?Indications:           Surveillance: Personal history of adenomatous polyps  ?                       on last colonoscopy > 5 years ago ?Providers:             Andrey Farmer MD, MD ?Referring MD:          Precious Bard, MD (Referring MD) ?Medicines:             Monitored Anesthesia Care ?Complications:         No immediate complications. ?Procedure:             Pre-Anesthesia Assessment: ?                       - Prior to the procedure, a History and Physical was  ?                       performed, and patient medications and allergies were  ?                       reviewed. The patient is competent. The risks and  ?                       benefits of the procedure and the sedation options and  ?                       risks were discussed with the patient. All questions  ?                       were answered and informed consent was obtained.  ?                       Patient identification and proposed procedure were  ?                       verified by the physician, the nurse, the  ?                       anesthesiologist, the anesthetist and the technician  ?                       in the endoscopy suite. Mental Status Examination:  ?                       alert and oriented. Airway Examination: normal  ?                       oropharyngeal airway and neck mobility. Respiratory  ?                       Examination: clear to auscultation. CV Examination:  ?  normal. Prophylactic Antibiotics: The patient does not  ?                       require prophylactic antibiotics. Prior  ?                       Anticoagulants: The patient has taken no  previous  ?                       anticoagulant or antiplatelet agents. ASA Grade  ?                       Assessment: II - A patient with mild systemic disease.  ?                       After reviewing the risks and benefits, the patient  ?                       was deemed in satisfactory condition to undergo the  ?                       procedure. The anesthesia plan was to use monitored  ?                       anesthesia care (MAC). Immediately prior to  ?                       administration of medications, the patient was  ?                       re-assessed for adequacy to receive sedatives. The  ?                       heart rate, respiratory rate, oxygen saturations,  ?                       blood pressure, adequacy of pulmonary ventilation, and  ?                       response to care were monitored throughout the  ?                       procedure. The physical status of the patient was  ?                       re-assessed after the procedure. ?                       After obtaining informed consent, the colonoscope was  ?                       passed under direct vision. Throughout the procedure,  ?                       the patient's blood pressure, pulse, and oxygen  ?                       saturations were monitored continuously. The  ?  Colonoscope was introduced through the anus and  ?                       advanced to the the cecum, identified by appendiceal  ?                       orifice and ileocecal valve. The colonoscopy was  ?                       performed without difficulty. The patient tolerated  ?                       the procedure well. The quality of the bowel  ?                       preparation was good. ?Findings: ?     The perianal and digital rectal examinations were normal. ?     Multiple small-mouthed diverticula were found in the sigmoid colon,  ?     descending colon and ascending colon. ?     The exam was otherwise without abnormality on direct and  retroflexion  ?     views. ?Impression:            - Diverticulosis in the sigmoid colon, in the  ?                       descending colon and in the ascending colon. ?                       - The examination was otherwise normal on direct and  ?                       retroflexion views. ?                       - No specimens collected. ?Recommendation:        - Discharge patient to home. ?                       - Resume previous diet. ?                       - Continue present medications. ?                       - Repeat colonoscopy in 10 years for surveillance. ?                       - Return to referring physician as previously  ?                       scheduled. ?Procedure Code(s):     --- Professional --- ?                       G0105, Colorectal cancer screening; colonoscopy on  ?                       individual at high risk ?Diagnosis Code(s):     --- Professional --- ?  Z86.010, Personal history of colonic polyps ?                       K57.30, Diverticulosis of large intestine without  ?                       perforation or abscess without bleeding ?CPT copyright 2019 [employer]. All rights reserved. ?The codes documented in this report are preliminary and upon coder review may  ?be revised to meet current compliance requirements. ?Andrey Farmer MD, MD ?12/19/2021 8:12:17 AM ?Number of Addenda: 0 ?Note Initiated On: 12/19/2021 6:56 AM ?Scope Withdrawal Time: 0 hours 6 minutes 20 seconds  ?Total Procedure Duration: 0 hours 10 minutes 39 seconds  ?Estimated Blood Loss:  Estimated blood loss: none. ?     [facility] ?

## 2021-12-19 NOTE — Anesthesia Procedure Notes (Signed)
Date/Time: 12/19/2021 8:03 AM ?Performed by: Junious Silk, CRNA ? ? ? ? ?

## 2021-12-19 NOTE — Interval H&P Note (Signed)
History and Physical Interval Note: ? ?12/19/2021 ?7:41 AM ? ?Laura Shaw  has presented today for surgery, with the diagnosis of Hx of polyps.  The various methods of treatment have been discussed with the patient and family. After consideration of risks, benefits and other options for treatment, the patient has consented to  Procedure(s): ?COLONOSCOPY WITH PROPOFOL (N/A) as a surgical intervention.  The patient's history has been reviewed, patient examined, no change in status, stable for surgery.  I have reviewed the patient's chart and labs.  Questions were answered to the patient's satisfaction.   ? ? ?Rossie Muskrat Jerrik Housholder ? ?Ok to proceed with colonoscopy ?

## 2021-12-22 ENCOUNTER — Encounter: Payer: Self-pay | Admitting: Gastroenterology

## 2022-08-27 ENCOUNTER — Ambulatory Visit
Admission: RE | Admit: 2022-08-27 | Discharge: 2022-08-27 | Disposition: A | Discharge status: Home / Self Care | Payer: No Typology Code available for payment source | Source: Ambulatory Visit | Attending: Physician Assistant | Admitting: Physician Assistant

## 2022-08-27 DIAGNOSIS — Z1231 Encounter for screening mammogram for malignant neoplasm of breast: Secondary | ICD-10-CM | POA: Diagnosis present

## 2023-04-30 ENCOUNTER — Other Ambulatory Visit: Payer: Self-pay | Admitting: Physician Assistant

## 2023-04-30 DIAGNOSIS — Z1231 Encounter for screening mammogram for malignant neoplasm of breast: Secondary | ICD-10-CM

## 2023-09-01 ENCOUNTER — Ambulatory Visit
Admission: RE | Admit: 2023-09-01 | Discharge: 2023-09-01 | Disposition: A | Discharge status: Home / Self Care | Payer: No Typology Code available for payment source | Source: Ambulatory Visit | Attending: Physician Assistant | Admitting: Physician Assistant

## 2023-09-01 DIAGNOSIS — Z1231 Encounter for screening mammogram for malignant neoplasm of breast: Secondary | ICD-10-CM | POA: Insufficient documentation

## 2024-05-04 ENCOUNTER — Other Ambulatory Visit: Payer: Self-pay | Admitting: Physician Assistant

## 2024-05-04 DIAGNOSIS — Z1231 Encounter for screening mammogram for malignant neoplasm of breast: Secondary | ICD-10-CM

## 2024-09-21 ENCOUNTER — Encounter

## 2024-10-18 ENCOUNTER — Encounter
